# Patient Record
Sex: Male | Born: 2000 | Race: Black or African American | Hispanic: No | Marital: Single | Smoking: Never smoker
Health system: Southern US, Community
[De-identification: ages and names within clinical notes are randomized; demographics above are authoritative.]

## PROBLEM LIST (undated history)

## (undated) DIAGNOSIS — J302 Other seasonal allergic rhinitis: Secondary | ICD-10-CM

## (undated) DIAGNOSIS — R569 Unspecified convulsions: Secondary | ICD-10-CM

## (undated) HISTORY — PX: NO PAST SURGERIES: SHX2092

---
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
## 2022-01-09 ENCOUNTER — Ambulatory Visit: Payer: Medicaid Other | Admitting: Neurology

## 2022-01-09 ENCOUNTER — Encounter: Payer: Self-pay | Admitting: Neurology

## 2022-01-09 ENCOUNTER — Telehealth (INDEPENDENT_AMBULATORY_CARE_PROVIDER_SITE_OTHER): Payer: Self-pay

## 2022-01-09 DIAGNOSIS — G40209 Localization-related (focal) (partial) symptomatic epilepsy and epileptic syndromes with complex partial seizures, not intractable, without status epilepticus: Secondary | ICD-10-CM

## 2022-01-09 DIAGNOSIS — Z029 Encounter for administrative examinations, unspecified: Secondary | ICD-10-CM

## 2022-01-09 NOTE — Telephone Encounter (Signed)
?  Name of who is calling: ?[place] ?Caller's Relationship to Patient: ?Mom ?Best contact number: ?[phone] ?Provider they see: ?Hickling ?Reason for call: ?Patient was referred out and missed an appt back in September that they were not aware they had. Recently called to r/s a different appt that was made after missing the first appt and now they are being referred back to the office to be referred somewhere else other then the original place due to 2 no shows. Please contact patient to advise ? ? ? ?PRESCRIPTION REFILL ONLY ? ?Name of prescription: ? ?Pharmacy: ? ? ?

## 2022-01-12 ENCOUNTER — Telehealth (INDEPENDENT_AMBULATORY_CARE_PROVIDER_SITE_OTHER): Payer: Self-pay | Admitting: Pediatrics

## 2022-01-12 NOTE — Telephone Encounter (Signed)
Left HIPAA compliant voicemail letting mom know referral was sent to [facility] and they will reach out to them to schedule an appointment.  ?

## 2022-01-12 NOTE — Telephone Encounter (Signed)
Duplicate encounter. See encounter from 01/09/2022 for further details.  ?

## 2022-01-12 NOTE — Telephone Encounter (Signed)
Who's calling (name and relationship to patient) : ?Victor Warner ? ?Best contact number: ?[phone] ? ?Provider they see: ?Dr. Sharene Skeans ? ?Reason for call: ?States he would like a referral placed by Dr. Sharene Skeans.  ?He isn't sure if a referral can still be placed because Dr. Sharene Skeans is retired and its been a while since he was seen.  ? ?Call ID:  ? ? ? ? ?PRESCRIPTION REFILL ONLY ? ?Name of prescription: ? ?Pharmacy: ? ? ? ? ? ?

## 2022-01-16 ENCOUNTER — Encounter: Payer: Medicaid Other | Admitting: Family Medicine

## 2022-01-27 ENCOUNTER — Other Ambulatory Visit (INDEPENDENT_AMBULATORY_CARE_PROVIDER_SITE_OTHER): Payer: Self-pay | Admitting: Family

## 2022-01-27 DIAGNOSIS — J309 Allergic rhinitis, unspecified: Secondary | ICD-10-CM | POA: Insufficient documentation

## 2022-01-27 DIAGNOSIS — G40209 Localization-related (focal) (partial) symptomatic epilepsy and epileptic syndromes with complex partial seizures, not intractable, without status epilepticus: Secondary | ICD-10-CM

## 2022-01-27 DIAGNOSIS — J029 Acute pharyngitis, unspecified: Secondary | ICD-10-CM | POA: Insufficient documentation

## 2022-01-29 ENCOUNTER — Ambulatory Visit: Payer: Medicaid Other | Admitting: Neurology

## 2022-01-29 ENCOUNTER — Encounter: Payer: Self-pay | Admitting: Neurology

## 2022-01-29 DIAGNOSIS — G40209 Localization-related (focal) (partial) symptomatic epilepsy and epileptic syndromes with complex partial seizures, not intractable, without status epilepticus: Secondary | ICD-10-CM | POA: Diagnosis not present

## 2022-01-29 MED ORDER — LEVETIRACETAM 750 MG PO TABS: 750.0000 mg | ORAL_TABLET | Freq: Two times a day (BID) | ORAL | 3 refills | Status: DC

## 2022-01-29 NOTE — Progress Notes (Signed)
? ?[facility] ? ?PATIENT: Victor Warner ?DOB: 2001/05/04 ? ?REQUESTING CLINICIAN: Rockwell Germany, NP ?HISTORY FROM: Patient and mother  ?REASON FOR VISIT: Establish care for epilepsy  ? ? ?HISTORICAL ? ?CHIEF COMPLAINT:  ?Chief Complaint  ?Patient presents with  ? New Patient (Initial Visit)  ?  Room 12, with ?NP Internal TOC from Dr Gaynell Face, hx of seizures ?Pt states he is stable and doing well on Keppra 500 mg 1.5 tab bid, states refills needed   ? ? ?HISTORY OF PRESENT ILLNESS:  ?This is a 21 year old male with past medical history of focal epilepsy who is presenting to establish care.  Patient was initially managed by Dr. Gaynell Face but he is now 37 and Dr. Gaynell Face also retired ? ?Per mom he was diagnosed with epilepsy while in middle school.  At that time he states year inform parent that he was staring and being unresponsive ? ?He did have a EEG which was negative and patient was started on the medication.  Mother does not remember that medication.  But he was continuing to have seizures while on that medication which will be later switched to levetiracetam.  In 2012 he did well, seizure-free then medication was discontinue but mother reported that 3 months later he had a breakthrough seizure.  Since then Keppra has been restarted.  His last seizure was in March 2020 in the setting of missing his levetiracetam dose.  Since then he has been seizure-free, compliant with his Keppra 750 mg twice daily and denies any side effect from the medication.  ? ? ?Handedness: Right handed ? ?Onset: In Middle school  ? ?Seizure Type: Staring spells, diagnosed with focal and focal to bilateral tonic clonic seizure  ? ?Current frequency: Last seizure in March 2020 ? ?Any injuries from seizures: Denies ? ?Seizure risk factors: None reported ? ?Previous ASMs: Levetiracetam ? ?Currenty ASMs: Levetiracetam 750 mg BID  ? ?ASMs side effects: Denies ? ?Brain Images: Normal CT head in 2018 ? ?Previous EEGs:  Previous EEG in 2012 showed sharp waves in the right central lead and in 2018 showing generalized spike and slow wave discharge during hyperventilation ? ?OTHER MEDICAL CONDITIONS: Allergies to shellfish, Soy, Peanut and Citrus food  ? ?REVIEW OF SYSTEMS: Full 14 system review of systems performed and negative with exception of: as noted in the HPI  ? ?ALLERGIES: ?Allergies  ?Allergen Reactions  ? Peanut-Containing Drug Products   ? Shellfish Allergy   ? Soy Allergy   ? Strawberry (Diagnostic)   ? Fruit & Vegetable Daily [Nutritional Supplements] Itching  ?  Citrus Fruit ?  ? ? ?HOME MEDICATIONS: ?Outpatient Medications Prior to Visit  ?Medication Sig Dispense Refill  ? Carbinoxamine Maleate (RYVENT) 6 MG TABS TAKE 1 TABLET BY MOUTH TWICE DAILY AS DIRECTED 60 tablet 5  ? EPINEPHrine 0.3 mg/0.3 mL IJ SOAJ injection INJECT 0.3 MLS INTO THE MUSCLE ONCE FOR 1 DOSE 4 each 1  ? KETOCONAZOLE, TOPICAL, 1 % SHAM apply shampoo 1% TOPICALLY to wet hair, lather, rinse thoroughly, and repeat; use every 3 to 4 days for up to 8 weeks; then as needed to control dandruff 200 mL 0  ? sodium fluoride (FLUORISHIELD) 1.1 % GEL dental gel     ? levETIRAcetam (KEPPRA) 500 MG tablet TAKE 1& 1/2 TABLETS BY MOUTH TWICE DAILY 3 tablet 0  ? ?No facility-administered medications prior to visit.  ? ? ?PAST MEDICAL HISTORY: ?Past Medical History:  ?Diagnosis Date  ? Seasonal allergies   ? Seizures (  [place])   ? Seizures ([place])   ? Eye contact  ? ? ?PAST SURGICAL HISTORY: ?Past Surgical History:  ?Procedure Laterality Date  ? NO PAST SURGERIES    ? ? ?FAMILY HISTORY: ?Family History  ?Problem Relation Age of Onset  ? Allergic rhinitis Mother   ? Allergic rhinitis Father   ? Allergic rhinitis Brother   ? Allergic rhinitis Maternal Aunt   ? Asthma Maternal Grandmother   ? Allergic rhinitis Brother   ? Asthma Brother   ? Angioedema Neg Hx   ? Atopy Neg Hx   ? Eczema Neg Hx   ? Immunodeficiency Neg Hx   ? Urticaria Neg Hx   ? ? ?SOCIAL HISTORY: ?Social  History  ? ?Socioeconomic History  ? Marital status: Single  ?  Spouse name: Not on file  ? Number of children: Not on file  ? Years of education: Not on file  ? Highest education level: Not on file  ?Occupational History  ? Not on file  ?Tobacco Use  ? Smoking status: Never  ? Smokeless tobacco: Never  ?Vaping Use  ? Vaping Use: Never used  ?Substance and Sexual Activity  ? Alcohol use: No  ? Drug use: No  ? Sexual activity: Not on file  ?Other Topics Concern  ? Not on file  ?Social History Narrative  ? 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  ? He attended [employer].  ? He lives with his grandmother. He has two brothers.  ? He enjoys football, baseball, and basketball.  ? He will attend [employer] in the Fall.  ? Majoring in [employer]  ? ?Social Determinants of Health  ? ?Financial Resource Strain: Not on file  ?Food Insecurity: Not on file  ?Transportation Needs: Not on file  ?Physical Activity: Not on file  ?Stress: Not on file  ?Social Connections: Not on file  ?Intimate Partner Violence: Not on file  ? ? ?PHYSICAL EXAM ? ?GENERAL EXAM/CONSTITUTIONAL: ?Vitals:  ?Vitals:  ? 01/29/22 0925  ?BP: (!) 144/82  ?Pulse: 76  ?Weight: 183 lb (83 kg)  ?Height: 6\' 2"  (1.88 m)  ? ?Body mass index is 23.5 kg/m?. ?Wt Readings from Last 3 Encounters:  ?01/29/22 183 lb (83 kg) (85 %, Z= 1.04)*  ?12/01/21 178 lb 3.2 oz (80.8 kg) (82 %, Z= 0.92)*  ?11/14/21 183 lb 2 oz (83.1 kg) (86 %, Z= 1.06)*  ? ?* Growth percentiles are based on CDC (Boys, 2-20 Years) data.  ? ?Patient is in no distress; well developed, nourished and groomed; neck is supple ? ?EYES: ?Pupils round and reactive to light, Visual fields full to confrontation, Extraocular movements intacts,  ?No results found. ? ?MUSCULOSKELETAL: ?Gait, strength, tone, movements noted in Neurologic exam below ? ?NEUROLOGIC: ?MENTAL STATUS:  ?   ? View : No data to display.  ?  ?  ?  ? ?awake, alert, oriented to person, place and time ?recent and remote memory  intact ?normal attention and concentration ?language fluent, comprehension intact, naming intact ?fund of knowledge appropriate ? ?CRANIAL NERVE:  ?2nd, 3rd, 4th, 6th - pupils equal and reactive to light, visual fields full to confrontation, extraocular muscles intact, no nystagmus ?5th - facial sensation symmetric ?7th - facial strength symmetric ?8th - hearing intact ?9th - palate elevates symmetrically, uvula midline ?11th - shoulder shrug symmetric ?12th - tongue protrusion midline ? ?MOTOR:  ?normal bulk and tone, full strength in the BUE, BLE ? ?SENSORY:  ?normal and symmetric to light touch,  pinprick, temperature, vibration ? ?COORDINATION:  ?finger-nose-finger, fine finger movements normal ? ?REFLEXES:  ?deep tendon reflexes present and symmetric ? ?GAIT/STATION:  ?normal ? ? ?DIAGNOSTIC DATA (LABS, IMAGING, TESTING) ?- I reviewed patient records, labs, notes, testing and imaging myself where available. ? ?Lab Results  ?Component Value Date  ? WBC 5.8 06/07/2017  ? HGB 14.2 06/07/2017  ? HCT 43.3 06/07/2017  ? MCV 83.8 06/07/2017  ? PLT 244 06/07/2017  ? ?   ?Component Value Date/Time  ? NA 135 06/07/2017 1512  ? K 3.8 06/07/2017 1512  ? CL 101 06/07/2017 1512  ? CO2 25 06/07/2017 1512  ? GLUCOSE 106 (H) 06/07/2017 1512  ? BUN 11 06/07/2017 1512  ? CREATININE 0.92 06/07/2017 1512  ? CALCIUM 9.4 06/07/2017 1512  ? PROT 7.7 06/07/2017 1512  ? ALBUMIN 4.5 06/07/2017 1512  ? AST 37 06/07/2017 1512  ? ALT 79 (H) 06/07/2017 1512  ? ALKPHOS 271 06/07/2017 1512  ? BILITOT 0.7 06/07/2017 1512  ? GFRNONAA NOT CALCULATED 06/07/2017 1512  ? GFRAA NOT CALCULATED 06/07/2017 1512  ? ?No results found for: CHOL, HDL, LDLCALC, LDLDIRECT, TRIG ?No results found for: HGBA1C ?No results found for: VITAMINB12 ?No results found for: TSH ? ? ?CT Head 2018 ?Unremarkable CT exam of the brain. ? ? ?EEG 2018 ?This EEG is slightly abnormal due to a few brief generalized discharges mostly during hyperventilation as described. The  findings could be suggestive of generalized seizure disorder and require careful clinical correlation. ? ? ? ?ASSESSMENT AND PLAN ? ?21 y.o. year old male  with history of food allergy and epilepsy who is presenting

## 2022-01-29 NOTE — Patient Instructions (Addendum)
Continue current medication, Keppra 750 mg twice daily ?Follow up in a 1 year or sooner if worse ?

## 2022-02-20 ENCOUNTER — Ambulatory Visit: Payer: Medicaid Other | Admitting: Neurology

## 2022-05-15 ENCOUNTER — Other Ambulatory Visit: Payer: Medicaid Other

## 2022-05-15 ENCOUNTER — Ambulatory Visit (INDEPENDENT_AMBULATORY_CARE_PROVIDER_SITE_OTHER): Payer: Self-pay | Admitting: Family Medicine

## 2022-05-15 VITALS — BP 135/72 | Ht 72.5 in | Wt 185.0 lb

## 2022-05-15 DIAGNOSIS — Z025 Encounter for examination for participation in sport: Secondary | ICD-10-CM

## 2022-05-15 NOTE — Progress Notes (Unsigned)
Sickle cell to be drawn at [facility]

## 2022-05-16 ENCOUNTER — Encounter: Payer: Self-pay | Admitting: Family Medicine

## 2022-05-16 NOTE — Progress Notes (Signed)
Established Patient Office Visit  Subjective   Patient ID: Victor Warner, male    DOB: [birth year]  Age: 21 y.o. MRN: [id number]  Sports physical. He is here today requesting sports physical as he is heading off to college in [place] later today.  He will be playing baseball, outfield,, third base and pitcher. He did not play baseball last year as he was at [employer] for 1 semester. He has no complaints today.  He denies any chest pain while exercising, shortness of breath, or episodes of passing out.  He has no history of concussions that he reports.  He denies any family history of sudden death before the age of 21.  ROS as listed above in HPI    Objective:     BP 135/72   Ht 6' 0.5" (1.842 m)   Wt 185 lb (83.9 kg)   BMI 24.75 kg/m   Physical Exam Constitutional:      General: He is not in acute distress.    Appearance: Normal appearance. He is normal weight. He is not ill-appearing, toxic-appearing or diaphoretic.  HENT:     Head: Normocephalic and atraumatic.     Right Ear: External ear normal.     Left Ear: External ear normal.     Nose: Nose normal. No congestion.     Mouth/Throat:     Mouth: Mucous membranes are moist.     Pharynx: No oropharyngeal exudate.  Eyes:     Extraocular Movements: Extraocular movements intact.     Conjunctiva/sclera: Conjunctivae normal.     Pupils: Pupils are equal, round, and reactive to light.  Cardiovascular:     Rate and Rhythm: Normal rate and regular rhythm.     Pulses: Normal pulses.     Heart sounds: Normal heart sounds. No murmur heard. Pulmonary:     Effort: Pulmonary effort is normal. No respiratory distress.     Breath sounds: No wheezing.  Abdominal:     General: Abdomen is flat. Bowel sounds are normal. There is no distension.     Tenderness: There is no abdominal tenderness. There is no guarding.  Musculoskeletal:        General: No swelling or deformity. Normal range of motion.     Cervical back: Normal range of motion  and neck supple. No tenderness.  Skin:    General: Skin is warm.     Capillary Refill: Capillary refill takes less than 2 seconds.  Neurological:     General: No focal deficit present.     Mental Status: He is alert and oriented to person, place, and time.     Cranial Nerves: No cranial nerve deficit.     Motor: No weakness.     Gait: Gait normal.     Deep Tendon Reflexes: Reflexes normal.  Psychiatric:        Mood and Affect: Mood normal.        Behavior: Behavior normal.        Thought Content: Thought content normal.        Judgment: Judgment normal.      Assessment & Plan:   Problem List Items Addressed This Visit       Other   Sports physical - Primary    He has no significant past medical history or complaints that would restrict him from playing at this time.  Sickle cell testing pending.  So he has been cleared pending sickle cell test results. 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  He is to have EpiPen with him at all times including baseball practice and to alert coaches and trainers.      Relevant Orders   Sickle Cell Scr    Return if symptoms worsen or fail to improve.    Claudie Leach, DO

## 2022-05-16 NOTE — Assessment & Plan Note (Addendum)
He has no significant past medical history or complaints that would restrict him from playing at this time.  Sickle cell testing pending.  So he has been cleared pending sickle cell test results. Upon chart review patient is found to have anaphylactic reaction to some peanuts.  He is to have EpiPen with him at all times including baseball practice and to alert coaches and trainers.

## 2022-05-16 NOTE — Progress Notes (Signed)
[facility]: Attending Note: I have reviewed the chart, discussed wit the Sports Medicine Fellow. I agree with assessment and treatment plan as detailed in the Fellow's note.  

## 2022-05-19 ENCOUNTER — Encounter: Payer: Self-pay | Admitting: Family Medicine

## 2022-05-19 LAB — SICKLE CELL SCREEN: Sickle Cell Screen: NEGATIVE

## 2022-05-28 ENCOUNTER — Ambulatory Visit: Payer: Medicaid Other | Admitting: Family Medicine

## 2022-05-29 ENCOUNTER — Telehealth: Payer: Self-pay

## 2022-05-29 NOTE — Telephone Encounter (Signed)
Patient's mother, Shaune Pascal - DOB/DPR verified - called in wanting to make sure our office didn't receive a letter from patient's insurance, [employer] -  stating/advising their insurance will be ending in October. Mom was advised our office has not received a letter as of today, Friday, 05/29/22 advising the above notation.  Mom stated she contacted Social Services - advised them of letter she received from [facility]. Mom stated she was was advised patient is covered through next year.  Changed patient's appt. - per mother's request -  to 08/12/22 @ 10 am w/Dr. Delorse Lek for 6-12 mth f/u due to patient being in college.  Mom verbalized understanding, no further questions.

## 2022-06-18 ENCOUNTER — Ambulatory Visit: Payer: Medicaid Other | Admitting: Allergy

## 2022-08-12 ENCOUNTER — Encounter: Payer: Self-pay | Admitting: Allergy

## 2022-08-12 ENCOUNTER — Ambulatory Visit (INDEPENDENT_AMBULATORY_CARE_PROVIDER_SITE_OTHER): Payer: Medicaid Other | Admitting: Allergy

## 2022-08-12 ENCOUNTER — Other Ambulatory Visit: Payer: Self-pay

## 2022-08-12 ENCOUNTER — Telehealth: Payer: Self-pay

## 2022-08-12 VITALS — BP 118/78 | HR 81 | Temp 97.9°F | Resp 18 | Ht 72.5 in | Wt 168.3 lb

## 2022-08-12 DIAGNOSIS — H1013 Acute atopic conjunctivitis, bilateral: Secondary | ICD-10-CM | POA: Diagnosis not present

## 2022-08-12 DIAGNOSIS — J3089 Other allergic rhinitis: Secondary | ICD-10-CM

## 2022-08-12 DIAGNOSIS — T781XXD Other adverse food reactions, not elsewhere classified, subsequent encounter: Secondary | ICD-10-CM

## 2022-08-12 DIAGNOSIS — T7800XD Anaphylactic reaction due to unspecified food, subsequent encounter: Secondary | ICD-10-CM

## 2022-08-12 MED ORDER — CARBINOXAMINE MALEATE 6 MG PO TABS: 1.0000 | ORAL_TABLET | Freq: Two times a day (BID) | ORAL | 11 refills | Status: DC

## 2022-08-12 MED ORDER — CROMOLYN SODIUM 4 % OP SOLN: 2.0000 [drp] | Freq: Four times a day (QID) | OPHTHALMIC | 11 refills | Status: DC | PRN

## 2022-08-12 MED ORDER — AZELASTINE HCL 0.1 % NA SOLN: 2.0000 | Freq: Two times a day (BID) | NASAL | 11 refills | Status: DC | PRN

## 2022-08-12 NOTE — Patient Instructions (Addendum)
Allergic rhinitis -Continue avoidance measures grass pollen, weed pollen, tree pollen, molds - Zyrtec, Xyzal, Allegra, Singulair have all been ineffective - Continue Ryvent 1 tab twice a day.   - May use Nasocort nasal spray 2 spray in each nostril once a day for a stuffy nose. - for nasal drainage use Astelin 2 sprays each nostril twice a day as needed - Use saline nasal rinse as needed to help flush out the nose - Consider allergy injections if medications are not effective in relieving symptoms.  Allergy injections provides a means of long-term control.  No allergy injections "re-train" and "reset" the immune system to ignore environmental allergens and decrease the resulting immune response to those allergens (sneezing, itchy watery eyes, runny nose, nasal congestion, etc).  Allergy injections improve symptoms in 75-85% of patients.   Let us know if you would like to proceed with this treatment option.  Allergic Conjunctivitis - Use Cromolyn 1-2 drop up to 3-4 times a day as needed for watery, red, itchy eyes  Food allergy - Continue to avoid peanut, tree nut, soy, and shellfish.  Continue to avoid citruis fruits as they bother you.   In case of an allergic reaction, give Benadryl 50 mg every 6 hours, and life-threatening symptoms occur, inject with EpiPen 0.3 mg  Consider separate in office oral food challenges to cashew/ pistachio, [place] nut, pecan/walnut, and peanut.  You need to bring the individual food item with you the day of the challenge.  He will need to be off all antihistamines 3 days prior to this appointment and he will need to be in good health.  (Not on any antibiotics.  This appointment will last approximately 2 to 3 hours.  If he is not interested in doing an in office oral challenge continue to avoid these foods   Follow up in 12  months or sooner if needed

## 2022-08-12 NOTE — Progress Notes (Signed)
Follow-up Note  RE: Victor Warner MRN: [id number] DOB: [birth year] Date of Office Visit: 08/12/2022   History of present illness: Victor Warner is a 21 y.o. male presenting today for follow-up of allergic rhinitis with conjunctivitis and allergy.  He was last seen in the office on 12/01/2021 by nurse practitioner Amada Jupiter.  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  He states he has done well since his last visit in general without any major health changes, surgeries or hospitalizations.  He states he ran out of his Ryvent about a month ago and he has been having more nasal drainage primarily he states the RyVent is very helpful for him when he has it available to take his symptoms are controlled.  With the Ryvent on board he states he has not needed to use his nose spray or an eyedrop.   He continues to avoid peanuts, tree nuts, soy and shellfish.  He also avoids some citrus fruits related to his allergies.  He has not had any accidental ingestions or need to use epinephrine device which is still up-to-date.  Review of systems: Review of Systems  Constitutional: Negative.   HENT:  Positive for rhinorrhea.   Eyes: Negative.   Respiratory: Negative.    Cardiovascular: Negative.   Musculoskeletal: Negative.   Skin: Negative.   Allergic/Immunologic: Negative.   Neurological: Negative.      All other systems negative unless noted above in HPI  Past medical/social/surgical/family history have been reviewed and are unchanged unless specifically indicated below.  No changes  Medication List: Current Outpatient Medications  Medication Sig Dispense Refill   azelastine (ASTELIN) 0.1 % nasal spray Place 2 sprays into both nostrils 2 (two) times daily as needed for rhinitis. Use in each nostril as directed 30 mL 11   cromolyn (OPTICROM) 4 % ophthalmic solution Place 2 drops into both eyes 4 (four) times daily as needed (Itchy, watery eyes). 10 mL 11   EPINEPHrine 0.3  mg/0.3 mL IJ SOAJ injection INJECT 0.3 MLS INTO THE MUSCLE ONCE FOR 1 DOSE 4 each 1   KETOCONAZOLE, TOPICAL, 1 % SHAM apply shampoo 1% TOPICALLY to wet hair, lather, rinse thoroughly, and repeat; use every 3 to 4 days for up to 8 weeks; then as needed to control dandruff 200 mL 0   levETIRAcetam (KEPPRA) 750 MG tablet Take 1 tablet (750 mg total) by mouth 2 (two) times daily. 180 tablet 3   sodium fluoride (FLUORISHIELD) 1.1 % GEL dental gel      Carbinoxamine Maleate (RYVENT) 6 MG TABS Take 1 tablet by mouth 2 (two) times daily. as directed 60 tablet 11   No current facility-administered medications for this visit.     Known medication allergies: Allergies  Allergen Reactions   Peanut-Containing Drug Products    Shellfish Allergy    Soy Allergy    Strawberry (Diagnostic)    Fruit & Vegetable Daily [Nutritional Supplements] Itching    Citrus Fruit      Physical examination: Blood pressure 118/78, pulse 81, temperature 97.9 F (36.6 C), resp. rate 18, height 6' 0.5" (1.842 m), weight 168 lb 4.8 oz (76.3 kg), SpO2 97 %.  General: Alert, interactive, in no acute distress. HEENT: PERRLA, TMs pearly gray, turbinates non-edematous without discharge, post-pharynx non erythematous. Neck: Supple without lymphadenopathy. Lungs: Clear to auscultation without wheezing, rhonchi or rales. {no increased work of breathing. CV: Normal S1, S2 without murmurs. Abdomen: Nondistended, nontender. Skin: Warm and dry, without lesions  or rashes. Extremities:  No clubbing, cyanosis or edema. Neuro:   Grossly intact.  Diagnositics/Labs: None today  Assessment and plan:   Allergic rhinitis -Continue avoidance measures grass pollen, weed pollen, tree pollen, molds - Zyrtec, Xyzal, Allegra, Singulair have all been ineffective - Continue Ryvent 1 tab twice a day.   - May use Nasocort nasal spray 2 spray in each nostril once a day for a stuffy nose. - for nasal drainage use Astelin 2 sprays each  nostril twice a day as needed - Use saline nasal rinse as needed to help flush out the nose - Consider allergy injections if medications are not effective in relieving symptoms.  Allergy injections provides a means of long-term control.  No allergy injections "re-train" and "reset" the immune system to ignore environmental allergens and decrease the resulting immune response to those allergens (sneezing, itchy watery eyes, runny nose, nasal congestion, etc).  Allergy injections improve symptoms in 75-85% of patients.   Let us know if you would like to proceed with this treatment option.  Allergic Conjunctivitis - Use Cromolyn 1-2 drop up to 3-4 times a day as needed for watery, red, itchy eyes  Food allergy/pollen food allergy syndrome - Continue to avoid peanut, tree nut, soy, and shellfish.  Continue to avoid citruis fruits as they bother you.   In case of an allergic reaction, give Benadryl 50 mg every 6 hours, and life-threatening symptoms occur, inject with EpiPen 0.3 mg  Consider separate in office oral food challenges to cashew/ pistachio, [place] nut, pecan/walnut, and peanut.  You need to bring the individual food item with you the day of the challenge.  He will need to be off all antihistamines 3 days prior to this appointment and he will need to be in good health.  (Not on any antibiotics.  This appointment will last approximately 2 to 3 hours.  If he is not interested in doing an in office oral challenge continue to avoid these foods   Follow up in 12  months or sooner if needed  I appreciate the opportunity to take part in Victor Warner's care. Please do not hesitate to contact me with questions.  Sincerely,   Margo Aye, MD Allergy/Immunology [facility]

## 2022-08-12 NOTE — Telephone Encounter (Signed)
PA request received via CMM for Carbinoxamine Maleate 4MG  tablets through [facility].   PA has been submitted and has been APPROVED from 08/12/2022-08/12/2023  Key: [id number] - PA Case ID: [id number]/14/2023

## 2022-09-17 ENCOUNTER — Other Ambulatory Visit: Payer: Self-pay | Admitting: Neurology

## 2022-09-17 DIAGNOSIS — G40209 Localization-related (focal) (partial) symptomatic epilepsy and epileptic syndromes with complex partial seizures, not intractable, without status epilepticus: Secondary | ICD-10-CM

## 2022-10-27 ENCOUNTER — Encounter: Payer: Self-pay | Admitting: Family Medicine

## 2023-02-01 ENCOUNTER — Ambulatory Visit (INDEPENDENT_AMBULATORY_CARE_PROVIDER_SITE_OTHER): Payer: Medicaid Other | Admitting: Neurology

## 2023-02-01 ENCOUNTER — Encounter: Payer: Self-pay | Admitting: Neurology

## 2023-02-01 VITALS — BP 133/78 | HR 86 | Ht 73.0 in | Wt 165.0 lb

## 2023-02-01 DIAGNOSIS — G40209 Localization-related (focal) (partial) symptomatic epilepsy and epileptic syndromes with complex partial seizures, not intractable, without status epilepticus: Secondary | ICD-10-CM

## 2023-02-01 DIAGNOSIS — Z5181 Encounter for therapeutic drug level monitoring: Secondary | ICD-10-CM

## 2023-02-01 MED ORDER — LEVETIRACETAM 750 MG PO TABS: 750.0000 mg | ORAL_TABLET | Freq: Two times a day (BID) | ORAL | 4 refills | Status: DC

## 2023-02-01 NOTE — Patient Instructions (Signed)
Continue with Keppra 750 mg twice daily, refill given Will check a Keppra level and vitamin D level Follow-up in 1 year or sooner if worse.

## 2023-02-01 NOTE — Progress Notes (Signed)
[facility]  PATIENT: Victor Warner DOB: Dec 23, 2000  REQUESTING CLINICIAN: Moses Manners, MD HISTORY FROM: Patient and mother  REASON FOR VISIT: Establish care for epilepsy    HISTORICAL  CHIEF COMPLAINT:  Chief Complaint  Patient presents with   Follow-up    RM12, brother and mother present SZ: last one was 8 years ago. No problems/concerns   INTERVAL HISTORY 02/01/2023:  Patient presents today for follow-up, he is accompanied by mother and brother.  Last visit was a year ago.  Since that he has not had any seizure or seizure-like activity.  He is compliant with the Keppra 750 mg twice daily, denies any side effect.  Overall he is doing very well.  He is in college major still undecided.  He is working towards getting his driver's license.  No other concerns no other complaints.   HISTORY OF PRESENT ILLNESS:  This is a 22 year old male with past medical history of focal epilepsy who is presenting to establish care.  Patient was initially managed by Dr. Sharene Skeans but he is now 76 and Dr. Sharene Skeans also retired  Per mom he was diagnosed with epilepsy while in middle school.  At that time he states year inform parent that he was staring and being unresponsive  He did have a EEG which was negative and patient was started on the medication.  Mother does not remember that medication.  But he was continuing to have seizures while on that medication which will be later switched to levetiracetam.  In 2012 he did well, seizure-free then medication was discontinue but mother reported that 3 months later he had a breakthrough seizure.  Since then Keppra has been restarted.  His last seizure was in March 2020 in the setting of missing his levetiracetam dose.  Since then he has been seizure-free, compliant with his Keppra 750 mg twice daily and denies any side effect from the medication.    Handedness: Right handed  Onset: In Middle school   Seizure Type: Staring spells,  diagnosed with focal and focal to bilateral tonic clonic seizure   Current frequency: Last seizure in March 2020  Any injuries from seizures: Denies  Seizure risk factors: None reported  Previous ASMs: Levetiracetam  Currenty ASMs: Levetiracetam 750 mg BID   ASMs side effects: Denies  Brain Images: Normal CT head in 2018  Previous EEGs: Previous EEG in 2012 showed sharp waves in the right central lead and in 2018 showing generalized spike and slow wave discharge during hyperventilation  OTHER MEDICAL CONDITIONS: Allergies to shellfish, Soy, Peanut and Citrus food   REVIEW OF SYSTEMS: Full 14 system review of systems performed and negative with exception of: as noted in the HPI   ALLERGIES: Allergies  Allergen Reactions   Peanut-Containing Drug Products    Shellfish Allergy    Soy Allergy    Strawberry (Diagnostic)    Fruit & Vegetable Daily [Nutritional Supplements] Itching    Citrus Fruit     HOME MEDICATIONS: Outpatient Medications Prior to Visit  Medication Sig Dispense Refill   azelastine (ASTELIN) 0.1 % nasal spray Place 2 sprays into both nostrils 2 (two) times daily as needed for rhinitis. Use in each nostril as directed 30 mL 11   Carbinoxamine Maleate (RYVENT) 6 MG TABS Take 1 tablet by mouth 2 (two) times daily. as directed 60 tablet 11   cromolyn (OPTICROM) 4 % ophthalmic solution Place 2 drops into both eyes 4 (four) times daily as needed (Itchy, watery eyes). 10  mL 11   EPINEPHrine 0.3 mg/0.3 mL IJ SOAJ injection INJECT 0.3 MLS INTO THE MUSCLE ONCE FOR 1 DOSE 4 each 1   KETOCONAZOLE, TOPICAL, 1 % SHAM apply shampoo 1% TOPICALLY to wet hair, lather, rinse thoroughly, and repeat; use every 3 to 4 days for up to 8 weeks; then as needed to control dandruff 200 mL 0   sodium fluoride (FLUORISHIELD) 1.1 % GEL dental gel      levETIRAcetam (KEPPRA) 750 MG tablet TAKE 1 TABLET(750 MG) BY MOUTH TWICE DAILY 180 tablet 4   No facility-administered medications prior to  visit.    PAST MEDICAL HISTORY: Past Medical History:  Diagnosis Date   Seasonal allergies    Seizures (HCC)    Seizures (HCC)    Eye contact    PAST SURGICAL HISTORY: Past Surgical History:  Procedure Laterality Date   NO PAST SURGERIES      FAMILY HISTORY: Family History  Problem Relation Age of Onset   Allergic rhinitis Mother    Allergic rhinitis Father    Allergic rhinitis Brother    Allergic rhinitis Maternal Aunt    Asthma Maternal Grandmother    Allergic rhinitis Brother    Asthma Brother    Angioedema Neg Hx    Atopy Neg Hx    Eczema Neg Hx    Immunodeficiency Neg Hx    Urticaria Neg Hx     SOCIAL HISTORY: Social History   Socioeconomic History   Marital status: Single    Spouse name: Not on file   Number of children: Not on file   Years of education: Not on file   Highest education level: Not on file  Occupational History   Not on file  Tobacco Use   Smoking status: Never   Smokeless tobacco: Never  Vaping Use   Vaping Use: Never used  Substance and Sexual Activity   Alcohol use: No   Drug use: No   Sexual activity: Not Currently  Other Topics Concern   Not on file  Social History Narrative   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   He attended [employer].   He lives with his grandmother. He has two brothers.   He enjoys football, baseball, and basketball.   He will attend [employer] in the Fall.   Majoring in Personnel officer   Social Determinants of Corporate investment banker Strain: Not on file  Food Insecurity: Not on file  Transportation Needs: Not on file  Physical Activity: Not on file  Stress: Not on file  Social Connections: Not on file  Intimate Partner Violence: Not on file    PHYSICAL EXAM  GENERAL EXAM/CONSTITUTIONAL: Vitals:  Vitals:   02/01/23 0859  BP: 133/78  Pulse: 86  Weight: 165 lb (74.8 kg)  Height: 6\' 1"  (1.854 m)   Body mass index is 21.77 kg/m. Wt Readings from Last 3 Encounters:   02/01/23 165 lb (74.8 kg)  08/12/22 168 lb 4.8 oz (76.3 kg) (70 %, Z= 0.52)*  05/15/22 185 lb (83.9 kg) (86 %, Z= 1.06)*   * Growth percentiles are based on CDC (Boys, 2-20 Years) data.   Patient is in no distress; well developed, nourished and groomed; neck is supple  MUSCULOSKELETAL: Gait, strength, tone, movements noted in Neurologic exam below  NEUROLOGIC: MENTAL STATUS:      No data to display         awake, alert, oriented to person, place and time recent and remote memory intact  normal attention and concentration language fluent, comprehension intact, naming intact fund of knowledge appropriate  CRANIAL NERVE:  2nd, 3rd, 4th, 6th - visual fields full to confrontation, extraocular muscles intact, no nystagmus 5th - facial sensation symmetric 7th - facial strength symmetric 8th - hearing intact 9th - palate elevates symmetrically, uvula midline 11th - shoulder shrug symmetric 12th - tongue protrusion midline  MOTOR:  normal bulk and tone, full strength in the BUE, BLE  SENSORY:  normal and symmetric to light touch  COORDINATION:  finger-nose-finger, fine finger movements normal  GAIT/STATION:  normal   DIAGNOSTIC DATA (LABS, IMAGING, TESTING) - I reviewed patient records, labs, notes, testing and imaging myself where available.  Lab Results  Component Value Date   WBC 5.8 06/07/2017   HGB 14.2 06/07/2017   HCT 43.3 06/07/2017   MCV 83.8 06/07/2017   PLT 244 06/07/2017      Component Value Date/Time   NA 135 06/07/2017 1512   K 3.8 06/07/2017 1512   CL 101 06/07/2017 1512   CO2 25 06/07/2017 1512   GLUCOSE 106 (H) 06/07/2017 1512   BUN 11 06/07/2017 1512   CREATININE 0.92 06/07/2017 1512   CALCIUM 9.4 06/07/2017 1512   PROT 7.7 06/07/2017 1512   ALBUMIN 4.5 06/07/2017 1512   AST 37 06/07/2017 1512   ALT 79 (H) 06/07/2017 1512   ALKPHOS 271 06/07/2017 1512   BILITOT 0.7 06/07/2017 1512   GFRNONAA NOT CALCULATED 06/07/2017 1512   GFRAA  NOT CALCULATED 06/07/2017 1512   No results found for: "CHOL", "HDL", "LDLCALC", "LDLDIRECT", "TRIG" No results found for: "HGBA1C" No results found for: "VITAMINB12" No results found for: "TSH"   CT Head 2018 Unremarkable CT exam of the brain.   EEG 2018 This EEG is slightly abnormal due to a few brief generalized discharges mostly during hyperventilation as described. The findings could be suggestive of generalized seizure disorder and require careful clinical correlation.    ASSESSMENT AND PLAN  22 y.o. year old male  with history of food allergy and epilepsy who is presenting for follow-up.Marland Kitchen  He is on Keppra 750 mg twice daily, tolerating medication very well, denies any side effects and his last seizure was in 2020 in the setting of missing his medicine.  Plan for today is to check a Keppra level, also check a vitamin D level.  I will continue him on Keppra 750 mg twice daily.  If no seizures, and next follow-up in a year we will discuss about coming off medication.  If patient plans to come off medication, we will first obtain a 3-day ambulatory EEG and if normal then we can taper him off.  This was discussed with patient and mother and they are comfortable with plan.  Continue Keppra for now 750 mg twice daily and I will see you in a year.     1. 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   Per [facility] statutes, patients with seizures are not allowed to drive until they have been seizure-free for six months.  Other recommendations include using caution when using heavy equipment or power tools. Avoid working on ladders or at heights. Take showers instead of baths.  Do not swim alone.  Ensure the water temperature is not  too high on the home water  heater. Do not go swimming alone. Do not lock yourself in a room alone (i.e. bathroom). When caring for infants or small children, sit down when holding, feeding, or changing them to minimize risk of injury to the child in the event you have a seizure. Maintain good sleep hygiene. Avoid alcohol.  Also recommend adequate sleep, hydration, good diet and minimize stress.   During the Seizure  - First, ensure adequate ventilation and place patients on the floor on their left side  Loosen clothing around the neck and ensure the airway is patent. If the patient is clenching the teeth, do not force the mouth open with any object as this can cause severe damage - Remove all items from the surrounding that can be hazardous. The patient may be oblivious to what's happening and may not even know what he or she is doing. If the patient is confused and wandering, either gently guide him/her away and block access to outside areas - Reassure the individual and be comforting - Call 911. In most cases, the seizure ends before EMS arrives. However, there are cases when seizures may last over 3 to 5 minutes. Or the individual may have developed breathing difficulties or severe injuries. If a pregnant patient or a person with diabetes develops a seizure, it is prudent to call an ambulance. - Finally, if the patient does not regain full consciousness, then call EMS. Most patients will remain confused for about 45 to 90 minutes after a seizure, so you must use judgment in calling for help. - Avoid restraints but make sure the patient is in a bed with padded side rails - Place the individual in a lateral position with the neck slightly flexed; this will help the saliva drain from the mouth and prevent the tongue from falling backward - Remove all nearby furniture and other hazards from the area - Provide verbal assurance as the individual is regaining consciousness - Provide the patient with privacy if possible - Call for  help and start treatment as ordered by the caregiver   After the Seizure (Postictal Stage)  After a seizure, most patients experience confusion, fatigue, muscle pain and/or a headache. Thus, one should permit the individual to sleep. For the next few days, reassurance is essential. Being calm and helping reorient the person is also of importance.  Most seizures are painless and end spontaneously. Seizures are not harmful to others but can lead to complications such as stress on the lungs, brain and the heart. Individuals with prior lung problems may develop labored breathing and respiratory distress.     Orders Placed This Encounter  Procedures   Levetiracetam level   Vitamin D, 25-hydroxy    Meds ordered this encounter  Medications   levETIRAcetam (KEPPRA) 750 MG tablet    Sig: Take 1 tablet (750 mg total) by mouth 2 (two) times daily.    Dispense:  180 tablet    Refill:  4    Return in about 1 year (around 02/01/2024).    Windell Norfolk, MD 02/01/2023, 9:20 AM  [facility] [address], [address] [place], [place] [address] [phone]

## 2023-02-03 LAB — LEVETIRACETAM LEVEL: Levetiracetam Lvl: 15.6 ug/mL (ref 10.0–40.0)

## 2023-02-03 LAB — VITAMIN D 25 HYDROXY (VIT D DEFICIENCY, FRACTURES): Vit D, 25-Hydroxy: 16.4 ng/mL — ABNORMAL LOW (ref 30.0–100.0)

## 2023-05-04 ENCOUNTER — Ambulatory Visit (INDEPENDENT_AMBULATORY_CARE_PROVIDER_SITE_OTHER): Payer: Medicaid Other | Admitting: Sports Medicine

## 2023-05-04 VITALS — BP 132/84 | HR 82 | Ht 72.5 in | Wt 168.0 lb

## 2023-05-04 DIAGNOSIS — Z025 Encounter for examination for participation in sport: Secondary | ICD-10-CM | POA: Diagnosis not present

## 2023-05-04 NOTE — Progress Notes (Signed)
   Subjective:    Patient ID: Victor Warner, male    DOB: July 24, 2001, 22 y.o.   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  He attends [employer] where he plays baseball.  Medical history significant for seizure disorder for which he takes medication.  Last seizure was 3 years ago.  Otherwise, he is healthy.  He denies any major orthopedic injuries or orthopedic surgeries in the past.    Review of Systems As above    Objective:   Physical Exam  Well-developed, well-nourished.  No acute distress  Cardiovascular: Regular rate.  No murmurs, rubs, or gallops. Lungs: Clear to auscultation bilaterally. Abdomen: Soft, nontender, nondistended Musculoskeletal: Full range of motion of all major joints.  Full strength.  Good stability of all major ligaments.      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  Follow-up as needed.  This note was dictated using Dragon naturally speaking software and may contain errors in syntax, spelling, or content which have not been identified prior to signing this note.

## 2023-06-16 DIAGNOSIS — S43004A Unspecified dislocation of right shoulder joint, initial encounter: Secondary | ICD-10-CM | POA: Diagnosis not present

## 2023-06-16 DIAGNOSIS — M25511 Pain in right shoulder: Secondary | ICD-10-CM | POA: Diagnosis not present

## 2023-06-18 ENCOUNTER — Ambulatory Visit: Payer: Medicaid Other | Admitting: Orthopedic Surgery

## 2023-07-02 DIAGNOSIS — S43004D Unspecified dislocation of right shoulder joint, subsequent encounter: Secondary | ICD-10-CM | POA: Diagnosis not present

## 2023-08-12 DIAGNOSIS — S43004A Unspecified dislocation of right shoulder joint, initial encounter: Secondary | ICD-10-CM | POA: Diagnosis not present

## 2023-09-04 ENCOUNTER — Other Ambulatory Visit: Payer: Self-pay | Admitting: Allergy

## 2023-10-04 ENCOUNTER — Other Ambulatory Visit: Payer: Self-pay | Admitting: Allergy

## 2023-12-13 ENCOUNTER — Encounter: Payer: Self-pay | Admitting: Neurology

## 2023-12-13 ENCOUNTER — Telehealth: Payer: Self-pay | Admitting: Neurology

## 2023-12-13 NOTE — Telephone Encounter (Signed)
 LVM and sent letter in mail informing pt of need to reschedule 02/01/24 appt - MD out

## 2024-02-01 ENCOUNTER — Ambulatory Visit: Payer: Medicaid Other | Admitting: Neurology

## 2024-02-08 ENCOUNTER — Encounter (HOSPITAL_COMMUNITY): Payer: Self-pay

## 2024-02-08 ENCOUNTER — Ambulatory Visit (INDEPENDENT_AMBULATORY_CARE_PROVIDER_SITE_OTHER)

## 2024-02-08 ENCOUNTER — Ambulatory Visit (HOSPITAL_COMMUNITY)
Admission: RE | Admit: 2024-02-08 | Discharge: 2024-02-08 | Disposition: A | Discharge status: Home / Self Care | Payer: Self-pay | Source: Ambulatory Visit | Attending: Nurse Practitioner | Admitting: Nurse Practitioner

## 2024-02-08 VITALS — BP 135/71 | HR 69 | Temp 98.4°F | Resp 18

## 2024-02-08 DIAGNOSIS — M76891 Other specified enthesopathies of right lower limb, excluding foot: Secondary | ICD-10-CM

## 2024-02-08 DIAGNOSIS — M25561 Pain in right knee: Secondary | ICD-10-CM | POA: Diagnosis not present

## 2024-02-08 NOTE — ED Triage Notes (Signed)
 Pt c/o pain to back of rt knee x2 months. Denies injury. States takes ibuprofen  for pain.

## 2024-02-08 NOTE — Discharge Instructions (Addendum)
 You were seen today for right knee pain that has been ongoing for about two months. The pain is located behind your knee and tends to worsen with prolonged standing, walking, or sudden movements while playing baseball. Based on your symptoms and physical exam, popliteal tendinitis is suspected, which is common in athletes and can result from overuse or strain. An X-ray was completed today to rule out any structural issues. You will be notified if anything abnormal is found; otherwise, you can view the results on your MyChart account. To help manage your symptoms, continue using RICE therapy: rest, ice the area for 15-20 minutes several times a day, compress with a soft brace if needed, and elevate the leg to reduce any discomfort. You may continue using over-the-counter medications like ibuprofen  or naproxen to relieve pain and inflammation. If your symptoms do not improve or begin to worsen, it is important to follow up with an orthopedic specialist, as additional imaging like an MRI may be needed for further evaluation. Avoid strenuous activity that aggravates your knee in the meantime.

## 2024-02-08 NOTE — ED Provider Notes (Signed)
 [facility]    CSN: [id number] Arrival date & time: 02/08/24  1556      History   Chief Complaint Chief Complaint  Patient presents with   appt - knee pain    HPI Victor Warner is a 23 y.o. male.   Victor Warner is a 23 y.o. male that presents with right knee pain that has been ongoing for approximately 2 months. The pain is localized to the back of the knee and occurs primarily when standing for extended periods, walking for extended periods or during sudden movements while playing baseball in the outfield. Short walks and sitting do not typically cause discomfort. The patient reports that the knee pain started "out of the blue" without a specific injury but he thinks it's related to playing baseball. The patient has not noticed any swelling in the knee area. He has tried using ice and motrin  for relief, which sometimes helps alleviate the pain. This is the first time the patient has sought medical attention for this knee issue, and he denies any previous injuries to the affected knee.  The following portions of the patient's history were reviewed and updated as appropriate: allergies, current medications, past family history, past medical history, past social history, past surgical history, and problem list.        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 Authorizing Provider  azelastine  (ASTELIN ) 0.1 % nasal spray Place 2 sprays into both nostrils 2 (two) times daily as needed for rhinitis. Use in each nostril as directed 08/12/22   Brian Campanile, MD  Carbinoxamine  Maleate (RYVENT ) 6 MG TABS Take 1 tablet by mouth 2 (two) times daily. as directed 08/12/22   Brian Campanile, MD  cromolyn  (OPTICROM ) 4 % ophthalmic solution Place 2 drops into both eyes 4 (four) times daily as needed (Itchy, watery eyes). 08/12/22   Brian Campanile, MD  EPINEPHrine  0.3 mg/0.3 mL IJ SOAJ injection INJECT 0.3 MLS INTO THE MUSCLE ONCE FOR 1 DOSE 12/01/21   Tinnie Forehand, FNP  KETOCONAZOLE , TOPICAL, 1 % SHAM apply shampoo 1% TOPICALLY to wet hair, lather, rinse thoroughly, and repeat; use every 3 to 4 days for up to 8 weeks; then as needed to control dandruff 11/14/21   Mordechai April, DO  levETIRAcetam  (KEPPRA ) 750 MG tablet Take 1 tablet (750 mg total) by mouth 2 (two) times daily. 02/01/23 04/26/24  Camara, Amadou, MD  sodium fluoride (FLUORISHIELD) 1.1 % GEL dental gel  08/21/20   [provider]    Family History Family History  Problem Relation Age of Onset   Allergic rhinitis Mother    Allergic rhinitis Father    Allergic rhinitis Brother    Allergic rhinitis Maternal Aunt  Asthma Maternal Grandmother    Allergic rhinitis Brother    Asthma Brother    Angioedema Neg Hx    Atopy Neg Hx    Eczema Neg Hx    Immunodeficiency Neg Hx    Urticaria Neg Hx     Social History Social History   Tobacco Use   Smoking status: Never   Smokeless tobacco: Never  Vaping Use   Vaping status: Never Used  Substance Use Topics   Alcohol use: No   Drug use: No     Allergies   Peanut-containing drug products, Shellfish allergy, Soy allergy (obsolete), Strawberry (diagnostic), and Fruit & vegetable daily [nutritional  supplements]   Review of Systems Review of Systems  Musculoskeletal:  Positive for arthralgias (right knee pain). Negative for gait problem.  Neurological:  Negative for weakness and numbness.     Physical Exam Triage Vital Signs ED Triage Vitals  Encounter Vitals Group     BP 02/08/24 1644 135/71     Systolic BP Percentile --      Diastolic BP Percentile --      Pulse Rate 02/08/24 1644 69     Resp 02/08/24 1644 18     Temp 02/08/24 1644 98.4 F (36.9 C)     Temp Source 02/08/24 1644 Oral     SpO2 02/08/24 1644 96 %     Weight --      Height --      Head Circumference --      Peak Flow --      Pain Score 02/08/24 1645 3     Pain Loc --      Pain Education --      Exclude from Growth Chart --    No data found.  Updated Vital Signs BP 135/71 (BP Location: Left Arm)   Pulse 69   Temp 98.4 F (36.9 C) (Oral)   Resp 18   SpO2 96%   Visual Acuity Right Eye Distance:   Left Eye Distance:   Bilateral Distance:    Right Eye Near:   Left Eye Near:    Bilateral Near:     Physical Exam Vitals reviewed.  Constitutional:      General: He is not in acute distress.    Appearance: Normal appearance. He is not toxic-appearing.  HENT:     Head: Normocephalic.     Mouth/Throat:     Mouth: Mucous membranes are moist.  Eyes:     Conjunctiva/sclera: Conjunctivae normal.  Cardiovascular:     Rate and Rhythm: Normal rate and regular rhythm.     Heart sounds: Normal heart sounds.  Pulmonary:     Effort: Pulmonary effort is normal.     Breath sounds: Normal breath sounds.  Musculoskeletal:        General: Normal range of motion.     Right knee: No swelling, deformity, effusion, erythema, ecchymosis or lacerations. Normal range of motion. Tenderness (popliteal fossa region) present. Normal alignment, normal meniscus and normal patellar mobility.     Comments: No calf pain or swelling   Skin:    General: Skin is warm and dry.  Neurological:     General: No focal deficit  present.     Mental Status: He is alert and oriented to person, place, and time.     Cranial Nerves: No cranial nerve deficit.     Sensory: Sensation is intact. No sensory deficit.     Motor: Motor function is intact. No weakness.  Coordination: Coordination is intact.     Gait: Gait is intact.      UC Treatments / Results  Labs (all labs ordered are listed, but only abnormal results are displayed) Labs Reviewed - No data to display  EKG   Radiology No results found.  Procedures Procedures (including critical care time)  Medications Ordered in UC Medications - No data to display  Initial Impression / Assessment and Plan / UC Course  I have reviewed the triage vital signs and the nursing notes.  Pertinent labs & imaging results that were available during my care of the patient were reviewed by me and considered in my medical decision making (see chart for details).    23 year old male presenting with right knee pain localized to the posterior aspect, ongoing for approximately two months. The pain is activity-related, worsened by prolonged standing, walking, or sudden movements during baseball, without a known injury. He denies any swelling, redness, or decreased range of motion, and this is his first evaluation for the issue. On physical exam, no obvious effusion, swelling, or abnormalities were noted. Popliteal tendinitis is suspected given the location of the pain and the patient's athletic activity. An X-ray of the knee was performed and results are pending; the patient will be notified if any abnormalities are found, otherwise results can be viewed on MyChart. Continued RICE therapy and use of over-the-counter NSAIDs were advised. Referral to orthopedics is recommended if symptoms do not improve, as further imaging such as MRI may be necessary for diagnosis and management.   Today's evaluation has revealed no signs of a dangerous process. Discussed diagnosis with patient  and/or guardian. Patient and/or guardian aware of their diagnosis, possible red flag symptoms to watch out for and need for close follow up. Patient and/or guardian understands verbal and written discharge instructions. Patient and/or guardian comfortable with plan and disposition.  Patient and/or guardian has a clear mental status at this time, good insight into illness (after discussion and teaching) and has clear judgment to make decisions regarding their care  Documentation was completed with the aid of voice recognition software. Transcription may contain typographical errors. Final Clinical Impressions(s) / UC Diagnoses   Final diagnoses:  Acute pain of right knee  Popliteus tendinitis of right lower extremity     Discharge Instructions      You were seen today for right knee pain that has been ongoing for about two months. The pain is located behind your knee and tends to worsen with prolonged standing, walking, or sudden movements while playing baseball. Based on your symptoms and physical exam, popliteal tendinitis is suspected, which is common in athletes and can result from overuse or strain. An X-ray was completed today to rule out any structural issues. You will be notified if anything abnormal is found; otherwise, you can view the results on your MyChart account. To help manage your symptoms, continue using RICE therapy: rest, ice the area for 15-20 minutes several times a day, compress with a soft brace if needed, and elevate the leg to reduce any discomfort. You may continue using over-the-counter medications like ibuprofen  or naproxen to relieve pain and inflammation. If your symptoms do not improve or begin to worsen, it is important to follow up with an orthopedic specialist, as additional imaging like an MRI may be needed for further evaluation. Avoid strenuous activity that aggravates your knee in the meantime.    ED Prescriptions   None    PDMP not reviewed this  encounter.  Maryruth Sol, [place] 02/08/24 1755

## 2024-02-09 ENCOUNTER — Ambulatory Visit: Payer: Self-pay

## 2024-02-09 NOTE — Patient Instructions (Signed)
 Allergic rhinitis Continue allergen avoidance measures directed toward grass pollen, tree pollen, weed pollen, and mold as listed below Consider saline nasal rinses as needed for nasal symptoms. Use this before any medicated nasal sprays for best result Consider updating your environmental allergy testing.  Remember to stop antihistamines 3 days before your testing appointment  Allergic conjunctivitis Continue cromolyn  1 to 2 drops in each eye up to 4 times a day if needed for red or itchy eyes  Food allergy Continue allergen avoidance measures directed toward peanut, tree nut, soy, and shellfish.  In case of an allergic reaction, take Benadryl 50 mg every 4 hours, and if life-threatening symptoms occur, inject with EpiPen  0.3 mg. Consider updating your food allergy testing.  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  Follow up in 1 year or sooner if needed.  The oral allergy syndrome (OAS) or pollen-food allergy syndrome (PFAS) is a relatively common form of food allergy, particularly in adults. It typically occurs in people who have pollen allergies when the immune system "sees" proteins on the food that look like proteins on the pollen. This results in the allergy antibody (IgE) binding to the food instead of the pollen. Patients typically report itching and/or mild swelling of the mouth and throat immediately following ingestion of certain uncooked fruits (including nuts) or raw vegetables. Only a very small number of affected individuals experience systemic allergic reactions, such as anaphylaxis which occurs with true food allergies.      Reducing Pollen Exposure The American Academy of Allergy, Asthma and Immunology suggests the following steps to reduce your exposure to pollen during allergy seasons. Do not hang  sheets or clothing out to dry; pollen may collect on these items. Do not mow lawns or spend time around freshly cut grass; mowing stirs up pollen. Keep windows closed at night.  Keep car windows closed while driving. Minimize morning activities outdoors, a time when pollen counts are usually at their highest. Stay indoors as much as possible when pollen counts or humidity is high and on windy days when pollen tends to remain in the air longer. Use air conditioning when possible.  Many air conditioners have filters that trap the pollen spores. Use a HEPA room air filter to remove pollen form the indoor air you breathe.  Control of Mold Allergen Mold and fungi can grow on a variety of surfaces provided certain temperature and moisture conditions exist.  Outdoor molds grow on plants, decaying vegetation and soil.  The major outdoor mold, Alternaria and Cladosporium, are found in very high numbers during hot and dry conditions.  Generally, a late Summer - Fall peak is seen for common outdoor fungal spores.  Rain will temporarily lower outdoor mold spore count, but counts rise rapidly when the rainy period ends.  The most important indoor molds are Aspergillus and Penicillium.  Dark, humid and poorly ventilated basements are ideal sites for mold growth.  The next most common sites of mold growth are the bathroom and the kitchen.  Outdoor [employer] Use air conditioning and keep windows closed Avoid exposure to decaying vegetation. Avoid leaf raking. Avoid grain handling. Consider wearing a face mask if working in moldy areas.  Indoor Mold Control Maintain humidity below 50%. Clean washable surfaces with 5% bleach solution. Remove sources e.g. Contaminated carpets.

## 2024-02-09 NOTE — Progress Notes (Signed)
 [address] [place] [address] Dept: [phone]  FOLLOW UP NOTE  Patient ID: Victor Warner, male    DOB: [birth year]  Age: 23 y.o. MRN: [id number] Date of Office Visit: 02/10/2024  Assessment  Chief Complaint: No chief complaint on file.  HPI Victor Warner is a 23 year old male who presents to the clinic for follow-up visit.  He was last seen in this clinic on 08/12/2022 by Dr. Tempie Fee for evaluation of allergic rhinitis, allergic conjunctivitis, and food allergy to peanut, tree nut, soy, and shellfish.  At today's visit, he reports his allergic rhinitis has been poorly controlled with symptoms including clear rhinorrhea, nasal congestion, sneezing, and postnasal drainage.  He reports these symptoms have been well-controlled while using carbinoxamine , however, he is out of this medication.  He continues Nasacort  1 spray in each nostril twice a day and occasionally uses nasal saline rinses.  His last environmental allergy skin testing was on 11/25/2018 that was positive to grass pollen, tree pollen, weed pollen, and mold.    Allergic conjunctivitis is reported as well-controlled with no symptoms including itching or redness.  He is not currently using an allergy eyedrop.    He continues to avoid peanuts, tree nuts, soy, and shellfish with no accidental ingestion or EpiPen  use since his last visit to this clinic.  His last food allergy skin testing was on 12/01/2021 and was positive to peanuts, tree nuts, soy, and shellfish. EpiPen  set is out of date and will be reordered at today's visit .  He continues to avoid some fresh fruits which make his mouth and throat tingly.  We discussed removing the skin of fruits may help alleviate this tingling as well as working different.  His current medications are listed in the chart.  Drug Allergies:  Allergies  Allergen Reactions   Peanut-Containing Drug Products    Shellfish Allergy    Soy Allergy (Obsolete)    Strawberry (Diagnostic)     Fruit & Vegetable Daily [Nutritional Supplements] Itching    Citrus Fruit     Physical Exam: BP 104/68   Pulse 76   Temp 98.3 F (36.8 C)   Ht 5\' 11"  (1.803 m)   Wt 156 lb 8 oz (71 kg)   SpO2 99%   BMI 21.83 kg/m    Physical Exam Vitals reviewed.  Constitutional:      Appearance: Normal appearance.  HENT:     Head: Normocephalic and atraumatic.     Right Ear: Tympanic membrane normal.     Left Ear: Tympanic membrane normal.     Nose:     Comments: Bilateral nares edematous and pale with more edema in the right nostril.  Pharynx normal.  Ears normal.  Eyes normal.    Mouth/Throat:     Pharynx: Oropharynx is clear.  Eyes:     Conjunctiva/sclera: Conjunctivae normal.  Cardiovascular:     Rate and Rhythm: Normal rate and regular rhythm.     Heart sounds: Normal heart sounds. No murmur heard. Pulmonary:     Effort: Pulmonary effort is normal.     Breath sounds: Normal breath sounds.     Comments: Lungs clear to auscultation Musculoskeletal:        General: Normal range of motion.     Cervical back: Normal range of motion and neck supple.  Skin:    General: Skin is warm and dry.  Neurological:     Mental Status: He is alert and oriented to person, place, and time.  Psychiatric:        Mood and Affect: Mood normal.        Behavior: Behavior normal.        Thought Content: Thought content normal.        Judgment: Judgment normal.    Assessment and Plan: 1. Seasonal and perennial allergic rhinitis   2. Allergic conjunctivitis of both eyes   3. Anaphylactic shock due to food, subsequent encounter   4. Pollen-food allergy, subsequent encounter     Meds ordered this encounter  Medications   azelastine  (ASTELIN ) 0.1 % nasal spray    Sig: Place 2 sprays into both nostrils 2 (two) times daily as needed for rhinitis. Use in each nostril as directed    Dispense:  30 mL    Refill:  11   Carbinoxamine  Maleate (RYVENT ) 6 MG TABS    Sig: Take 1 tablet (6 mg total) by mouth  2 (two) times daily. as directed    Dispense:  60 tablet    Refill:  11   cromolyn  (OPTICROM ) 4 % ophthalmic solution    Sig: Place 2 drops into both eyes 4 (four) times daily as needed (Itchy, watery eyes).    Dispense:  10 mL    Refill:  11   EPINEPHrine  0.3 mg/0.3 mL IJ SOAJ injection    Sig: INJECT 0.3 MLS INTO THE MUSCLE ONCE FOR 1 DOSE    Dispense:  4 each    Refill:  1    Please dispense mylan or teva generic brand    Patient Instructions  Allergic rhinitis Continue allergen avoidance measures directed toward grass pollen, tree pollen, weed pollen, and mold as listed below Consider saline nasal rinses as needed for nasal symptoms. Use this before any medicated nasal sprays for best result Consider updating your environmental allergy testing.  Remember to stop antihistamines 3 days before your testing appointment  Allergic conjunctivitis Continue cromolyn  1 to 2 drops in each eye up to 4 times a day if needed for red or itchy eyes  Food allergy Continue allergen avoidance measures directed toward peanut, tree nut, soy, and shellfish.  In case of an allergic reaction, take Benadryl 50 mg every 4 hours, and if life-threatening symptoms occur, inject with EpiPen  0.3 mg. Consider updating your food allergy testing.  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  Follow up in 1 year or sooner if needed.   Return in about 1 year (around 02/09/2025), or if symptoms worsen or fail to improve.    Thank you for the opportunity to care for this patient.  Please do not hesitate to contact me with questions.  Marinus Sic, FNP [facility] 

## 2024-02-10 ENCOUNTER — Encounter: Payer: Self-pay | Admitting: Family Medicine

## 2024-02-10 ENCOUNTER — Ambulatory Visit: Admitting: Family Medicine

## 2024-02-10 ENCOUNTER — Other Ambulatory Visit: Payer: Self-pay

## 2024-02-10 VITALS — BP 104/68 | HR 76 | Temp 98.3°F | Ht 71.0 in | Wt 156.5 lb

## 2024-02-10 DIAGNOSIS — J302 Other seasonal allergic rhinitis: Secondary | ICD-10-CM

## 2024-02-10 DIAGNOSIS — T781XXD Other adverse food reactions, not elsewhere classified, subsequent encounter: Secondary | ICD-10-CM

## 2024-02-10 DIAGNOSIS — H1013 Acute atopic conjunctivitis, bilateral: Secondary | ICD-10-CM

## 2024-02-10 DIAGNOSIS — J3089 Other allergic rhinitis: Secondary | ICD-10-CM

## 2024-02-10 DIAGNOSIS — T7800XD Anaphylactic reaction due to unspecified food, subsequent encounter: Secondary | ICD-10-CM

## 2024-02-10 MED ORDER — AZELASTINE HCL 0.1 % NA SOLN
2.0000 | Freq: Two times a day (BID) | NASAL | 11 refills | Status: AC | PRN
Start: 2024-02-10 — End: ?

## 2024-02-10 MED ORDER — EPINEPHRINE 0.3 MG/0.3ML IJ SOAJ: INTRAMUSCULAR | 1 refills | Status: AC

## 2024-02-10 MED ORDER — CARBINOXAMINE MALEATE 6 MG PO TABS: 1.0000 | ORAL_TABLET | Freq: Two times a day (BID) | ORAL | 11 refills | Status: AC

## 2024-02-10 MED ORDER — CROMOLYN SODIUM 4 % OP SOLN: 2.0000 [drp] | Freq: Four times a day (QID) | OPHTHALMIC | 11 refills | Status: AC | PRN

## 2024-02-15 ENCOUNTER — Telehealth: Payer: Self-pay

## 2024-02-15 NOTE — Telephone Encounter (Signed)
 Approved today by [facility] [place]  [place]icaid [place] Case: [id number], Status: Approved, Coverage Starts on: 02/15/2024 12:00:00 AM, Coverage Ends on: 02/14/2025 12:00:00 AM. Effective Date: 02/15/2024 Authorization Expiration Date: 02/14/2025

## 2024-02-15 NOTE — Telephone Encounter (Signed)
*  Asthma/Allergy  Pharmacy Patient Advocate Encounter   Received notification from CoverMyMeds that prior authorization for RyVent  6MG  tablets  is required/requested.   Insurance verification completed.   The patient is insured through [facility] .   Per test claim: PA required; PA submitted to above mentioned insurance via CoverMyMeds Key/confirmation #/[id number] Status is pending

## 2024-02-23 ENCOUNTER — Ambulatory Visit
Admission: RE | Admit: 2024-02-23 | Discharge: 2024-02-23 | Disposition: A | Discharge status: Home / Self Care | Source: Ambulatory Visit | Attending: Family Medicine | Admitting: Family Medicine

## 2024-02-23 ENCOUNTER — Ambulatory Visit (INDEPENDENT_AMBULATORY_CARE_PROVIDER_SITE_OTHER)

## 2024-02-23 ENCOUNTER — Ambulatory Visit: Payer: Self-pay | Admitting: Nurse Practitioner

## 2024-02-23 VITALS — BP 124/86 | HR 72 | Temp 98.7°F | Resp 18

## 2024-02-23 DIAGNOSIS — R109 Unspecified abdominal pain: Secondary | ICD-10-CM

## 2024-02-23 LAB — POCT URINALYSIS DIP (MANUAL ENTRY)
Bilirubin, UA: NEGATIVE
Blood, UA: NEGATIVE
Glucose, UA: NEGATIVE mg/dL
Ketones, POC UA: NEGATIVE mg/dL
Leukocytes, UA: NEGATIVE
Nitrite, UA: NEGATIVE
Protein Ur, POC: NEGATIVE mg/dL
Spec Grav, UA: 1.02 (ref 1.010–1.025)
Urobilinogen, UA: 0.2 U/dL
pH, UA: 7 (ref 5.0–8.0)

## 2024-02-23 NOTE — Discharge Instructions (Addendum)
 Your workup done in the clinic today was normal.  Given your persistent symptoms I advise you follow-up with gastroenterology and/or your PCP for further evaluation of your symptoms.  Please go to the emergency room if you develop any worsening symptoms which include but not limited to fever, worsening abdominal pain, vomiting, blood in stool, or any new concerns that arise.  Hope you feel better soon!

## 2024-02-23 NOTE — ED Provider Notes (Signed)
 [facility]    CSN: [id number] Arrival date & time: 02/23/24  9562      History   Chief Complaint Chief Complaint  Patient presents with   Abdominal Pain    On and off Sharp pain on the lower right side of my abdomen. - Entered by patient    HPI Victor Warner is a 23 y.o. male presents for abdominal pain.  Patient has a past medical history of seizures who presents with 1 month of intermittent worsening right sided abdominal pain.  States the pain occurs every 1 to 2 days and is located on his mid right abdomen.  It happens multiple times a day and does occasionally radiate to the left side of his abdomen.  He states is a sharp pain and is associated with nausea and decreased appetite.  Denies any fevers, vomiting, bloating, GERD symptoms, diarrhea, constipation, dysuria.  Reports last BM was yesterday and was normal for him.  Denies any history of GI diagnoses such as Crohn's, IBS, colitis, diverticulitis.  Denies any recent strenuous activity or known inciting event.  He cannot identify any aggravating or alleviating factors.  He does state he mostly feels it when he is laying down and then turns over to one side.  He has not taken any OTC treatments for symptoms.  No other concerns at this time.   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 Authorizing Provider  azelastine  (ASTELIN ) 0.1 % nasal spray Place 2 sprays into both nostrils 2 (two) times daily as needed for rhinitis. Use in each nostril as directed 02/10/24   Ambs, Jeanmarie Millet, FNP  Carbinoxamine  Maleate (RYVENT ) 6 MG TABS Take 1 tablet (6 mg total) by mouth 2 (two) times daily. as directed 02/10/24   Ambs, Jeanmarie Millet, FNP  cromolyn  (OPTICROM ) 4 % ophthalmic solution Place 2 drops into both eyes 4 (four) times daily as needed (Itchy, watery eyes). 02/10/24   Ardie Kras, FNP  EPINEPHrine  0.3 mg/0.3 mL IJ SOAJ injection INJECT 0.3 MLS INTO THE MUSCLE ONCE FOR 1 DOSE 02/10/24   Ambs, Jeanmarie Millet, FNP  KETOCONAZOLE , TOPICAL, 1 % SHAM apply shampoo 1% TOPICALLY to wet hair, lather, rinse thoroughly, and repeat; use every 3 to 4 days for up to 8 weeks; then as needed to control dandruff 11/14/21   Mordechai April, DO  levETIRAcetam  (KEPPRA ) 750 MG tablet Take 1 tablet (750 mg total) by mouth 2 (two) times daily. 02/01/23 04/26/24  Camara, Amadou, MD  sodium fluoride (FLUORISHIELD) 1.1 % GEL dental gel  08/21/20   [provider]    Family History Family History  Problem Relation Age of Onset   Allergic rhinitis Mother  Allergic rhinitis Father    Allergic rhinitis Brother    Allergic rhinitis Maternal Aunt    Asthma Maternal Grandmother    Allergic rhinitis Brother    Asthma Brother    Angioedema Neg Hx    Atopy Neg Hx    Eczema Neg Hx    Immunodeficiency Neg Hx    Urticaria Neg Hx     Social History Social History   Tobacco Use   Smoking status: Never   Smokeless tobacco: Never  Vaping Use   Vaping status: Never Used  Substance Use Topics   Alcohol use: No   Drug use: No     Allergies   Peanut-containing drug products, Shellfish allergy, Soy allergy (obsolete), Strawberry (diagnostic), and Fruit & vegetable daily [nutritional supplements]   Review of  Systems Review of Systems  Constitutional:  Positive for appetite change.  Gastrointestinal:  Positive for abdominal pain and nausea.     Physical Exam Triage Vital Signs ED Triage Vitals  Encounter Vitals Group     BP 02/23/24 0943 124/86     Systolic BP Percentile --      Diastolic BP Percentile --      Pulse Rate 02/23/24 0942 72     Resp 02/23/24 0942 18     Temp 02/23/24 0942 98.7 F (37.1 C)     Temp Source 02/23/24 0942 Oral     SpO2 02/23/24 0942 97 %     Weight --      Height --      Head Circumference --      Peak Flow --      Pain Score 02/23/24 0942 4     Pain Loc --      Pain Education --      Exclude from Growth Chart --    No data found.  Updated Vital Signs BP 124/86   Pulse 72   Temp 98.7 F (37.1 C) (Oral)   Resp 18   SpO2 97%   Visual Acuity Right Eye Distance:   Left Eye Distance:   Bilateral Distance:    Right Eye Near:   Left Eye Near:    Bilateral Near:     Physical Exam Vitals and nursing note reviewed.  Constitutional:      General: He is not in acute distress.    Appearance: Normal appearance. He is not ill-appearing.  HENT:     Head: Normocephalic and atraumatic.  Eyes:     Pupils: Pupils are equal, round, and reactive to light.  Cardiovascular:     Rate and Rhythm: Normal rate.  Pulmonary:     Effort: Pulmonary effort is normal.  Abdominal:     General: Bowel sounds are normal. There is no distension.     Palpations: Abdomen is soft.     Tenderness: There is no abdominal tenderness. There is no guarding or rebound. Negative signs include McBurney's sign.  Skin:    General: Skin is warm and dry.  Neurological:     General: No focal deficit present.     Mental Status: He is alert and oriented to person, place, and time.  Psychiatric:        Mood and Affect: Mood normal.        Behavior: Behavior normal.      UC Treatments / Results  Labs (all labs ordered are listed, but only abnormal results are displayed) Labs  Reviewed  POCT URINALYSIS DIP (MANUAL ENTRY)    EKG   Radiology DG  Abd 1 View Result Date: 02/23/2024 CLINICAL DATA:  Abdominal pain for a month EXAM: ABDOMEN - 1 VIEW COMPARISON:  None Available. FINDINGS: The bowel gas pattern is normal. No radio-opaque calculi or other significant radiographic abnormality are seen. IMPRESSION: Negative. Electronically Signed   By: Fredrich Jefferson M.D.   On: 02/23/2024 10:26    Procedures Procedures (including critical care time)  Medications Ordered in UC Medications - No data to display  Initial Impression / Assessment and Plan / UC Course  I have reviewed the triage vital signs and the nursing notes.  Pertinent labs & imaging results that were available during my care of the patient were reviewed by me and considered in my medical decision making (see chart for details).     Reviewed exam and symptoms with patient.  No red flags.  Urine negative.  Abdominal x-ray unremarkable.  Exam is also unremarkable and patient is no tenderness with palpation.  Unclear cause of patient's symptoms but no signs of acute abdomen on exam.  Will have him follow-up with GI and/or his PCP for further workup of symptoms.  He was instructed to go to the ER for any worsening symptoms that occur prior to him seen the specialist or his PCP, red flags reviewed and he verbalized understanding. Final Clinical Impressions(s) / UC Diagnoses   Final diagnoses:  Abdominal pain, unspecified abdominal location     Discharge Instructions      Your workup done in the clinic today was normal.  Given your persistent symptoms I advise you follow-up with gastroenterology and/or your PCP for further evaluation of your symptoms.  Please go to the emergency room if you develop any worsening symptoms which include but not limited to fever, worsening abdominal pain, vomiting, blood in stool, or any new concerns that arise.  Hope you feel better soon!   ED Prescriptions   None     PDMP not reviewed this encounter.   Alleen Arbour, NP 02/23/24 1034

## 2024-02-23 NOTE — ED Triage Notes (Signed)
 Pt present with sharp rt lower abd pain x 1 mo.   States he also has had loss of appetite and nausea. Pt states he has taken tylenol  to reduce pain.

## 2024-02-24 ENCOUNTER — Ambulatory Visit (INDEPENDENT_AMBULATORY_CARE_PROVIDER_SITE_OTHER): Admitting: Neurology

## 2024-02-24 ENCOUNTER — Encounter: Payer: Self-pay | Admitting: Neurology

## 2024-02-24 VITALS — BP 128/80 | HR 79 | Resp 15 | Ht 72.0 in | Wt 155.0 lb

## 2024-02-24 DIAGNOSIS — Z5181 Encounter for therapeutic drug level monitoring: Secondary | ICD-10-CM | POA: Diagnosis not present

## 2024-02-24 DIAGNOSIS — G40309 Generalized idiopathic epilepsy and epileptic syndromes, not intractable, without status epilepticus: Secondary | ICD-10-CM

## 2024-02-24 MED ORDER — LEVETIRACETAM 750 MG PO TABS: 750.0000 mg | ORAL_TABLET | Freq: Two times a day (BID) | ORAL | 4 refills | Status: AC

## 2024-02-24 NOTE — Progress Notes (Signed)
 [facility]  PATIENT: Victor Warner DOB: 2001-01-01  REQUESTING CLINICIAN: Candee Cha, MD HISTORY FROM: Patient and mother  REASON FOR VISIT: Establish care for epilepsy    HISTORICAL  CHIEF COMPLAINT:  Chief Complaint  Patient presents with   Seizures    Rm12, alone, WU:[id number] episode 3 yrs ago, well controlled    INTERVAL HISTORY 02/24/2024:  Patient presents today for follow-up, he is alone.  Last visit was a year ago, since then he has been doing well, no seizure or seizure-like activity.  His last seizure was in March 2020.  He reports compliance with his levetiracetam  750 mg twice daily, no side effect from the medication and would like to stay on the medication.  No other questions, no other concerns.  He is in college, undecided major end plays baseball   INTERVAL HISTORY 02/01/2023:  Patient presents today for follow-up, he is accompanied by mother and brother.  Last visit was a year ago.  Since that he has not had any seizure or seizure-like activity.  He is compliant with the Keppra  750 mg twice daily, denies any side effect.  Overall he is doing very well.  He is in college major still undecided.  He is working towards getting his driver's license.  No other concerns no other complaints.   HISTORY OF PRESENT ILLNESS:  This is a 23 year old male with past medical history of focal epilepsy who is presenting to establish care.  Patient was initially managed by Dr. Darlys Eland but he is now 35 and Dr. Darlys Eland also retired  Per mom he was diagnosed with epilepsy while in middle school.  At that time he states year inform parent that he was staring and being unresponsive  He did have a EEG which was negative and patient was started on the medication.  Mother does not remember that medication.  But he was continuing to have seizures while on that medication which will be later switched to levetiracetam .  In 2012 he did well, seizure-free then medication  was discontinue but mother reported that 3 months later he had a breakthrough seizure.  Since then Keppra  has been restarted.  His last seizure was in March 2020 in the setting of missing his levetiracetam  dose.  Since then he has been seizure-free, compliant with his Keppra  750 mg twice daily and denies any side effect from the medication.    Handedness: Right handed  Onset: In Middle school   Seizure Type: Staring spells, diagnosed with focal and focal to bilateral tonic clonic seizure   Current frequency: Last seizure in March 2020  Any injuries from seizures: Denies  Seizure risk factors: None reported  Previous ASMs: Levetiracetam   Currenty ASMs: Levetiracetam  750 mg BID   ASMs side effects: Denies  Brain Images: Normal CT head in 2018  Previous EEGs: Previous EEG in 2012 showed sharp waves in the right central lead and in 2018 showing generalized spike and slow wave discharge during hyperventilation  OTHER MEDICAL CONDITIONS: Allergies to shellfish, Soy, Peanut and Citrus food   REVIEW OF SYSTEMS: Full 14 system review of systems performed and negative with exception of: as noted in the HPI   ALLERGIES: Allergies  Allergen Reactions   Peanut-Containing Drug Products    Shellfish Allergy    Soy Allergy (Obsolete)    Strawberry (Diagnostic)    Fruit & Vegetable Daily [Nutritional Supplements] Itching    Citrus Fruit     HOME MEDICATIONS: Outpatient Medications Prior to Visit  Medication  Sig Dispense Refill   azelastine  (ASTELIN ) 0.1 % nasal spray Place 2 sprays into both nostrils 2 (two) times daily as needed for rhinitis. Use in each nostril as directed 30 mL 11   Carbinoxamine  Maleate (RYVENT ) 6 MG TABS Take 1 tablet (6 mg total) by mouth 2 (two) times daily. as directed 60 tablet 11   cromolyn  (OPTICROM ) 4 % ophthalmic solution Place 2 drops into both eyes 4 (four) times daily as needed (Itchy, watery eyes). 10 mL 11   EPINEPHrine  0.3 mg/0.3 mL IJ SOAJ injection  INJECT 0.3 MLS INTO THE MUSCLE ONCE FOR 1 DOSE 4 each 1   KETOCONAZOLE , TOPICAL, 1 % SHAM apply shampoo 1% TOPICALLY to wet hair, lather, rinse thoroughly, and repeat; use every 3 to 4 days for up to 8 weeks; then as needed to control dandruff 200 mL 0   sodium fluoride (FLUORISHIELD) 1.1 % GEL dental gel      levETIRAcetam  (KEPPRA ) 750 MG tablet Take 1 tablet (750 mg total) by mouth 2 (two) times daily. 180 tablet 4   No facility-administered medications prior to visit.    PAST MEDICAL HISTORY: Past Medical History:  Diagnosis Date   Seasonal allergies    Seizures (HCC)    Seizures (HCC)    Eye contact    PAST SURGICAL HISTORY: Past Surgical History:  Procedure Laterality Date   NO PAST SURGERIES      FAMILY HISTORY: Family History  Problem Relation Age of Onset   Allergic rhinitis Mother    Allergic rhinitis Father    Allergic rhinitis Brother    Allergic rhinitis Maternal Aunt    Asthma Maternal Grandmother    Allergic rhinitis Brother    Asthma Brother    Angioedema Neg Hx    Atopy Neg Hx    Eczema Neg Hx    Immunodeficiency Neg Hx    Urticaria Neg Hx     SOCIAL HISTORY: Social History   Socioeconomic History   Marital status: Single    Spouse name: Not on file   Number of children: Not on file   Years of education: Not on file   Highest education level: Not on file  Occupational History   Not on file  Tobacco Use   Smoking status: Never   Smokeless tobacco: Never  Vaping Use   Vaping status: Never Used  Substance and Sexual Activity   Alcohol use: No   Drug use: No   Sexual activity: Not Currently  Other Topics Concern   Not on file  Social History Narrative   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   He attended [employer].   He lives with his grandmother. He has two brothers.   He enjoys football, baseball, and basketball.   He will attend [employer] in the Fall.   Majoring in Personnel officer   Social Drivers of Manufacturing engineer Strain: Not on file  Food Insecurity: Not on file  Transportation Needs: Not on file  Physical Activity: Not on file  Stress: Not on file  Social Connections: Not on file  Intimate Partner Violence: Not on file    PHYSICAL EXAM  GENERAL EXAM/CONSTITUTIONAL: Vitals:  Vitals:   02/24/24 1003  BP: 128/80  Pulse: 79  Resp: 15  SpO2: 96%  Weight: 155 lb (70.3 kg)  Height: 6' (1.829 m)   Body mass index is 21.02 kg/m. Wt Readings from Last 3 Encounters:  02/24/24 155 lb (70.3 kg)  02/10/24 156  lb 8 oz (71 kg)  05/04/23 168 lb (76.2 kg)   Patient is in no distress; well developed, nourished and groomed; neck is supple  MUSCULOSKELETAL: Gait, strength, tone, movements noted in Neurologic exam below  NEUROLOGIC: MENTAL STATUS:      No data to display         awake, alert, oriented to person, place and time recent and remote memory intact normal attention and concentration language fluent, comprehension intact, naming intact fund of knowledge appropriate  CRANIAL NERVE:  2nd, 3rd, 4th, 6th - visual fields full to confrontation, extraocular muscles intact, no nystagmus 5th - facial sensation symmetric 7th - facial strength symmetric 8th - hearing intact 9th - palate elevates symmetrically, uvula midline 11th - shoulder shrug symmetric 12th - tongue protrusion midline  MOTOR:  normal bulk and tone, full strength in the BUE, BLE  SENSORY:  normal and symmetric to light touch  COORDINATION:  finger-nose-finger, fine finger movements normal  GAIT/STATION:  normal   DIAGNOSTIC DATA (LABS, IMAGING, TESTING) - I reviewed patient records, labs, notes, testing and imaging myself where available.  Lab Results  Component Value Date   WBC 5.8 06/07/2017   HGB 14.2 06/07/2017   HCT 43.3 06/07/2017   MCV 83.8 06/07/2017   PLT 244 06/07/2017      Component Value Date/Time   NA 135 06/07/2017 1512   K 3.8 06/07/2017 1512   CL 101  06/07/2017 1512   CO2 25 06/07/2017 1512   GLUCOSE 106 (H) 06/07/2017 1512   BUN 11 06/07/2017 1512   CREATININE 0.92 06/07/2017 1512   CALCIUM 9.4 06/07/2017 1512   PROT 7.7 06/07/2017 1512   ALBUMIN 4.5 06/07/2017 1512   AST 37 06/07/2017 1512   ALT 79 (H) 06/07/2017 1512   ALKPHOS 271 06/07/2017 1512   BILITOT 0.7 06/07/2017 1512   GFRNONAA NOT CALCULATED 06/07/2017 1512   GFRAA NOT CALCULATED 06/07/2017 1512   No results found for: "CHOL", "HDL", "LDLCALC", "LDLDIRECT", "TRIG" No results found for: "HGBA1C" No results found for: "VITAMINB12" No results found for: "TSH"   CT Head 2018 Unremarkable CT exam of the brain.   EEG 2018 This EEG is slightly abnormal due to a few brief generalized discharges mostly during hyperventilation as described. The findings could be suggestive of generalized seizure disorder and require careful clinical correlation.   ASSESSMENT AND PLAN  23 y.o. year old male  with history of food allergy and epilepsy who is presenting for follow-up.Aaron Aas  He is on Keppra  750 mg twice daily, tolerating medication very well, denies any side effects and his last seizure was in 2020 in the setting of missing his medicine.  Plan for today is to check a Keppra  level, also check a vitamin D  level.  I will continue him on Keppra  750 mg twice daily.  I will see him in a year for follow up     1. Nonintractable generalized idiopathic epilepsy without status epilepticus (HCC)   2. Therapeutic drug monitoring       Patient Instructions  Continue with levetiracetam  750 mg twice daily, refill given Will check levetiracetam  level with vitamin D  Follow-up in 1 year or sooner if worse Please contact if you do have a seizure or any other questions.   Per [place]  DMV statutes, patients with seizures are not allowed to drive until they have been seizure-free for six months.  Other recommendations include using caution when using heavy equipment or power tools.  Avoid working on Paediatric nurse  or at heights. Take showers instead of baths.  Do not swim alone.  Ensure the water temperature is not too high on the home water heater. Do not go swimming alone. Do not lock yourself in a room alone (i.e. bathroom). When caring for infants or small children, sit down when holding, feeding, or changing them to minimize risk of injury to the child in the event you have a seizure. Maintain good sleep hygiene. Avoid alcohol.  Also recommend adequate sleep, hydration, good diet and minimize stress.   During the Seizure  - First, ensure adequate ventilation and place patients on the floor on their left side  Loosen clothing around the neck and ensure the airway is patent. If the patient is clenching the teeth, do not force the mouth open with any object as this can cause severe damage - Remove all items from the surrounding that can be hazardous. The patient may be oblivious to what's happening and may not even know what he or she is doing. If the patient is confused and wandering, either gently guide him/her away and block access to outside areas - Reassure the individual and be comforting - Call 911. In most cases, the seizure ends before EMS arrives. However, there are cases when seizures may last over 3 to 5 minutes. Or the individual may have developed breathing difficulties or severe injuries. If a pregnant patient or a person with diabetes develops a seizure, it is prudent to call an ambulance. - Finally, if the patient does not regain full consciousness, then call EMS. Most patients will remain confused for about 45 to 90 minutes after a seizure, so you must use judgment in calling for help. - Avoid restraints but make sure the patient is in a bed with padded side rails - Place the individual in a lateral position with the neck slightly flexed; this will help the saliva drain from the mouth and prevent the tongue from falling backward - Remove all nearby furniture and other  hazards from the area - Provide verbal assurance as the individual is regaining consciousness - Provide the patient with privacy if possible - Call for help and start treatment as ordered by the caregiver   After the Seizure (Postictal Stage)  After a seizure, most patients experience confusion, fatigue, muscle pain and/or a headache. Thus, one should permit the individual to sleep. For the next few days, reassurance is essential. Being calm and helping reorient the person is also of importance.  Most seizures are painless and end spontaneously. Seizures are not harmful to others but can lead to complications such as stress on the lungs, brain and the heart. Individuals with prior lung problems may develop labored breathing and respiratory distress.     Orders Placed This Encounter  Procedures   Levetiracetam  level   Vitamin D , 25-hydroxy    Meds ordered this encounter  Medications   levETIRAcetam  (KEPPRA ) 750 MG tablet    Sig: Take 1 tablet (750 mg total) by mouth 2 (two) times daily.    Dispense:  180 tablet    Refill:  4    Return in about 1 year (around 02/23/2025).  The patient's condition of epilepsy requires frequent monitoring and adjustments in the treatment plan, reflecting the ongoing complexity of care.  This provider is the continuing focal point for all needed services for this condition.   Cassandra Cleveland, MD 02/24/2024, 10:28 AM  [facility] [address], [address] [place], [place] [address] [phone]

## 2024-02-24 NOTE — Patient Instructions (Addendum)
 Continue with levetiracetam  750 mg twice daily, refill given Will check levetiracetam  level with vitamin D  Follow-up in 1 year or sooner if worse Please contact if you do have a seizure or any other questions.

## 2024-02-26 LAB — LEVETIRACETAM LEVEL: Levetiracetam Lvl: <2 ug/mL — ABNORMAL LOW (ref 10.0–40.0)

## 2024-02-26 LAB — VITAMIN D 25 HYDROXY (VIT D DEFICIENCY, FRACTURES): Vit D, 25-Hydroxy: 23.7 ng/mL — ABNORMAL LOW (ref 30.0–100.0)

## 2024-02-28 ENCOUNTER — Ambulatory Visit: Payer: Self-pay | Admitting: Neurology

## 2024-05-10 ENCOUNTER — Ambulatory Visit

## 2024-05-10 ENCOUNTER — Ambulatory Visit
Admission: RE | Admit: 2024-05-10 | Discharge: 2024-05-10 | Disposition: A | Discharge status: Home / Self Care | Source: Ambulatory Visit

## 2024-05-10 VITALS — BP 119/78 | HR 85 | Temp 98.1°F | Resp 16 | Ht 72.0 in | Wt 160.0 lb

## 2024-05-10 DIAGNOSIS — L237 Allergic contact dermatitis due to plants, except food: Secondary | ICD-10-CM

## 2024-05-10 MED ORDER — PREDNISONE 20 MG PO TABS: ORAL_TABLET | ORAL | 0 refills | Status: AC

## 2024-05-10 MED ORDER — TRIAMCINOLONE ACETONIDE 0.1 % EX OINT: 1.0000 | TOPICAL_OINTMENT | Freq: Two times a day (BID) | CUTANEOUS | 1 refills | Status: AC

## 2024-05-10 MED ORDER — DEXAMETHASONE SODIUM PHOSPHATE 10 MG/ML IJ SOLN
10.0000 mg | Freq: Once | INTRAMUSCULAR | Status: AC
  Administered 2024-05-10: 10 mg via INTRAMUSCULAR

## 2024-05-10 MED ORDER — FEXOFENADINE HCL 180 MG PO TABS: 180.0000 mg | ORAL_TABLET | Freq: Every day | ORAL | 0 refills | Status: AC

## 2024-05-10 NOTE — ED Triage Notes (Signed)
 Both hands swollen - Entered by patient

## 2024-05-10 NOTE — Discharge Instructions (Addendum)
  1. Poison ivy dermatitis (Primary) - dexamethasone  (DECADRON ) IM injection 10 mg given in UC for acute skin reaction secondary to poison ivy - triamcinolone  ointment (KENALOG ) 0.1 %; Apply 1 Application topically 2 (two) times daily.  Dispense: 30 g; Refill: 1 - fexofenadine  (ALLEGRA ) 180 MG tablet; Take 1 tablet (180 mg total) by mouth daily.  Dispense: 30 tablet; Refill: 0 - predniSONE  (DELTASONE ) 20 MG tablet; Take 3 tablets (60 mg total) by mouth daily for 4 days, THEN 2 tablets (40 mg total) daily for 5 days, THEN 1 tablet (20 mg total) daily for 5 days.  Dispense: 27 tablet; Refill: 0 -Continue to monitor symptoms for any change in severity if there is any escalation of current symptoms or development of new symptoms follow-up in ER for further evaluation and management.

## 2024-05-10 NOTE — ED Provider Notes (Signed)
 [facility]  Note:  This document was prepared using Dragon voice recognition software and may include unintentional dictation errors.  MRN: [id number] DOB: 19-Dec-2000  Subjective:   Victor Warner is a 23 y.o. male presenting for extremely pruritic rash to bilateral hands and lower arms x 4 to 5 days.  Patient reports that he works in [employer] and may have been exposed to poison ivy.  Patient has been using over-the-counter calamine lotion with minimal improvement.  Patient reports that sometimes after using calamine the rash becomes more pruritic.  Patient has mild swelling to bilateral hands, erythema, severe itching.  Patient has not taken any over-the-counter antihistamine or other medication to treat symptoms.  Patient denies any other known exposures to allergens or allergic substances.  Patient denies any upper respiratory involvement, no swelling to the lips, tongue, face, wheezing, stridor.   Current Facility-Administered Medications:    dexamethasone  (DECADRON ) injection 10 mg, 10 mg, Intramuscular, Once, Tyshia Fenter B, NP  Current Outpatient Medications:    calamine lotion, Apply 1 Application topically as needed for itching., Disp: , Rfl:    diphenhydrAMINE (BENADRYL) 25 mg capsule, Take 25 mg by mouth every 6 (six) hours as needed., Disp: , Rfl:    fexofenadine  (ALLEGRA ) 180 MG tablet, Take 1 tablet (180 mg total) by mouth daily., Disp: 30 tablet, Rfl: 0   predniSONE  (DELTASONE ) 20 MG tablet, Take 3 tablets (60 mg total) by mouth daily for 4 days, THEN 2 tablets (40 mg total) daily for 5 days, THEN 1 tablet (20 mg total) daily for 5 days., Disp: 27 tablet, Rfl: 0   triamcinolone  ointment (KENALOG ) 0.1 %, Apply 1 Application topically 2 (two) times daily., Disp: 30 g, Rfl: 1   azelastine  (ASTELIN ) 0.1 % nasal spray, Place 2 sprays into both nostrils 2 (two) times daily as needed for rhinitis. Use in each nostril as directed, Disp: 30 mL, Rfl: 11    Carbinoxamine  Maleate (RYVENT ) 6 MG TABS, Take 1 tablet (6 mg total) by mouth 2 (two) times daily. as directed, Disp: 60 tablet, Rfl: 11   cromolyn  (OPTICROM ) 4 % ophthalmic solution, Place 2 drops into both eyes 4 (four) times daily as needed (Itchy, watery eyes)., Disp: 10 mL, Rfl: 11   EPINEPHrine  0.3 mg/0.3 mL IJ SOAJ injection, INJECT 0.3 MLS INTO THE MUSCLE ONCE FOR 1 DOSE, Disp: 4 each, Rfl: 1   KETOCONAZOLE , TOPICAL, 1 % SHAM, apply shampoo 1% TOPICALLY to wet hair, lather, rinse thoroughly, and repeat; use every 3 to 4 days for up to 8 weeks; then as needed to control dandruff, Disp: 200 mL, Rfl: 0   levETIRAcetam  (KEPPRA ) 750 MG tablet, Take 1 tablet (750 mg total) by mouth 2 (two) times daily., Disp: 180 tablet, Rfl: 4   sodium fluoride (FLUORISHIELD) 1.1 % GEL dental gel, , Disp: , Rfl:    Allergies  Allergen Reactions   Peanut-Containing Drug Products    Shellfish Allergy    Soy Allergy (Obsolete)    Strawberry (Diagnostic)    Fruit & Vegetable Daily [Nutritional Supplements] Itching    Citrus Fruit     Past Medical History:  Diagnosis Date   Seasonal allergies    Seizures (HCC)    Seizures (HCC)    Eye contact     Past Surgical History:  Procedure Laterality Date   NO PAST SURGERIES      Family History  Problem Relation Age of Onset   Allergic rhinitis Mother    Allergic rhinitis  Father    Allergic rhinitis Brother    Allergic rhinitis Maternal Aunt    Asthma Maternal Grandmother    Allergic rhinitis Brother    Asthma Brother    Angioedema Neg Hx    Atopy Neg Hx    Eczema Neg Hx    Immunodeficiency Neg Hx    Urticaria Neg Hx     Social History   Tobacco Use   Smoking status: Never   Smokeless tobacco: Never  Vaping Use   Vaping status: Never Used  Substance Use Topics   Alcohol use: No   Drug use: No    ROS Refer to HPI for ROS details.  Objective:    Vitals: BP 119/78 (BP Location: Left Arm)   Pulse 85   Temp 98.1 F (36.7 C) (Oral)    Resp 16   Ht 6' (1.829 m)   Wt 160 lb (72.6 kg)   SpO2 97%   BMI 21.70 kg/m   Physical Exam Vitals and nursing note reviewed.  Constitutional:      General: He is not in acute distress.    Appearance: Normal appearance. He is well-developed. He is not ill-appearing or toxic-appearing.  HENT:     Head: Normocephalic.     Mouth/Throat:     Mouth: Mucous membranes are moist.     Pharynx: Oropharynx is clear.  Eyes:     Extraocular Movements: Extraocular movements intact.     Conjunctiva/sclera: Conjunctivae normal.  Cardiovascular:     Rate and Rhythm: Normal rate.  Pulmonary:     Effort: Pulmonary effort is normal. No respiratory distress.  Skin:    General: Skin is warm and dry.     Findings: Erythema and rash (Extremely pruritic, erythematous, papular rash noted to bilateral hands and forearms, antecubital space, no other secondary symptoms noted.) present. Rash is papular.  Neurological:     General: No focal deficit present.     Mental Status: He is alert and oriented to person, place, and time.  Psychiatric:        Mood and Affect: Mood normal.        Behavior: Behavior normal.     Procedures  No results found for this or any previous visit (from the past 24 hours).  Assessment and Plan :     Discharge Instructions       1. Poison ivy dermatitis (Primary) - dexamethasone  (DECADRON ) IM injection 10 mg given in UC for acute skin reaction secondary to poison ivy - triamcinolone  ointment (KENALOG ) 0.1 %; Apply 1 Application topically 2 (two) times daily.  Dispense: 30 g; Refill: 1 - fexofenadine  (ALLEGRA ) 180 MG tablet; Take 1 tablet (180 mg total) by mouth daily.  Dispense: 30 tablet; Refill: 0 - predniSONE  (DELTASONE ) 20 MG tablet; Take 3 tablets (60 mg total) by mouth daily for 4 days, THEN 2 tablets (40 mg total) daily for 5 days, THEN 1 tablet (20 mg total) daily for 5 days.  Dispense: 27 tablet; Refill: 0 -Continue to monitor symptoms for any change in  severity if there is any escalation of current symptoms or development of new symptoms follow-up in ER for further evaluation and management.      Terance Pomplun B Zaylen Susman   [place], [place], [place] 05/10/24 1236

## 2024-05-10 NOTE — ED Triage Notes (Signed)
 First noticed last Friday, progressively worse, trying Calamine lotion (that just makes it itchier).

## 2024-05-12 ENCOUNTER — Ambulatory Visit (HOSPITAL_COMMUNITY)

## 2024-05-12 DIAGNOSIS — Z130 Encounter for screening for diseases of the blood and blood-forming organs and certain disorders involving the immune mechanism: Secondary | ICD-10-CM | POA: Diagnosis not present

## 2025-02-09 ENCOUNTER — Ambulatory Visit: Admitting: Allergy
